# Patient Record
Sex: Male | Born: 1990 | Race: Black or African American | Hispanic: No | Marital: Single | Smoking: Current every day smoker
Health system: Southern US, Community
[De-identification: ages and names within clinical notes are randomized; demographics above are authoritative.]

## PROBLEM LIST (undated history)

## (undated) DIAGNOSIS — F329 Major depressive disorder, single episode, unspecified: Secondary | ICD-10-CM

## (undated) DIAGNOSIS — F32A Depression, unspecified: Secondary | ICD-10-CM

---
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
## 2013-03-09 ENCOUNTER — Emergency Department (HOSPITAL_COMMUNITY)
Admission: EM | Admit: 2013-03-09 | Discharge: 2013-03-11 | Disposition: A | Discharge status: Other Acute Inpatient Hospital | Payer: No Typology Code available for payment source | Attending: Emergency Medicine | Admitting: Emergency Medicine

## 2013-03-09 ENCOUNTER — Encounter (HOSPITAL_COMMUNITY): Payer: Self-pay | Admitting: Emergency Medicine

## 2013-03-09 ENCOUNTER — Emergency Department (HOSPITAL_COMMUNITY): Payer: Self-pay

## 2013-03-09 DIAGNOSIS — F32A Depression, unspecified: Secondary | ICD-10-CM

## 2013-03-09 DIAGNOSIS — M25559 Pain in unspecified hip: Secondary | ICD-10-CM | POA: Insufficient documentation

## 2013-03-09 DIAGNOSIS — F3289 Other specified depressive episodes: Secondary | ICD-10-CM | POA: Insufficient documentation

## 2013-03-09 DIAGNOSIS — R443 Hallucinations, unspecified: Secondary | ICD-10-CM | POA: Insufficient documentation

## 2013-03-09 DIAGNOSIS — R45851 Suicidal ideations: Secondary | ICD-10-CM | POA: Insufficient documentation

## 2013-03-09 DIAGNOSIS — F329 Major depressive disorder, single episode, unspecified: Secondary | ICD-10-CM

## 2013-03-09 DIAGNOSIS — F17200 Nicotine dependence, unspecified, uncomplicated: Secondary | ICD-10-CM | POA: Insufficient documentation

## 2013-03-09 DIAGNOSIS — Z0289 Encounter for other administrative examinations: Secondary | ICD-10-CM | POA: Insufficient documentation

## 2013-03-09 DIAGNOSIS — F22 Delusional disorders: Secondary | ICD-10-CM | POA: Insufficient documentation

## 2013-03-09 DIAGNOSIS — F322 Major depressive disorder, single episode, severe without psychotic features: Secondary | ICD-10-CM | POA: Diagnosis present

## 2013-03-09 HISTORY — DX: Depression, unspecified: F32.A

## 2013-03-09 HISTORY — DX: Major depressive disorder, single episode, unspecified: F32.9

## 2013-03-09 LAB — CBC WITH DIFFERENTIAL/PLATELET
BASOS ABS: 0 10*3/uL (ref 0.0–0.1)
BASOS PCT: 1 % (ref 0–1)
EOS ABS: 0.1 10*3/uL (ref 0.0–0.7)
EOS PCT: 2 % (ref 0–5)
HEMATOCRIT: 44.1 % (ref 39.0–52.0)
Hemoglobin: 15.6 g/dL (ref 13.0–17.0)
Lymphocytes Relative: 28 % (ref 12–46)
Lymphs Abs: 1.6 10*3/uL (ref 0.7–4.0)
MCH: 30.5 pg (ref 26.0–34.0)
MCHC: 35.4 g/dL (ref 30.0–36.0)
MCV: 86.3 fL (ref 78.0–100.0)
MONO ABS: 0.4 10*3/uL (ref 0.1–1.0)
Monocytes Relative: 7 % (ref 3–12)
Neutro Abs: 3.7 10*3/uL (ref 1.7–7.7)
Neutrophils Relative %: 63 % (ref 43–77)
Platelets: 223 10*3/uL (ref 150–400)
RBC: 5.11 MIL/uL (ref 4.22–5.81)
RDW: 12 % (ref 11.5–15.5)
WBC: 5.8 10*3/uL (ref 4.0–10.5)

## 2013-03-09 LAB — RAPID URINE DRUG SCREEN, HOSP PERFORMED
Amphetamines: NOT DETECTED
BENZODIAZEPINES: NOT DETECTED
Barbiturates: NOT DETECTED
COCAINE: NOT DETECTED
Opiates: NOT DETECTED
Tetrahydrocannabinol: POSITIVE — AB

## 2013-03-09 LAB — COMPREHENSIVE METABOLIC PANEL
ALBUMIN: 4.4 g/dL (ref 3.5–5.2)
ALT: 16 U/L (ref 0–53)
AST: 23 U/L (ref 0–37)
Alkaline Phosphatase: 61 U/L (ref 39–117)
BUN: 18 mg/dL (ref 6–23)
CALCIUM: 9.6 mg/dL (ref 8.4–10.5)
CO2: 26 mEq/L (ref 19–32)
CREATININE: 1.08 mg/dL (ref 0.50–1.35)
Chloride: 100 mEq/L (ref 96–112)
GFR calc Af Amer: >90 mL/min (ref >90)
GFR calc non Af Amer: >90 mL/min (ref >90)
Glucose, Bld: 92 mg/dL (ref 70–99)
POTASSIUM: 3.7 meq/L (ref 3.7–5.3)
Sodium: 139 mEq/L (ref 137–147)
TOTAL PROTEIN: 8.2 g/dL (ref 6.0–8.3)
Total Bilirubin: 0.4 mg/dL (ref 0.3–1.2)

## 2013-03-09 LAB — URINALYSIS, ROUTINE W REFLEX MICROSCOPIC
Bilirubin Urine: NEGATIVE
Glucose, UA: NEGATIVE mg/dL
Hgb urine dipstick: NEGATIVE
Ketones, ur: NEGATIVE mg/dL
LEUKOCYTES UA: NEGATIVE
NITRITE: NEGATIVE
PH: 6 (ref 5.0–8.0)
Protein, ur: NEGATIVE mg/dL
SPECIFIC GRAVITY, URINE: 1.039 — AB (ref 1.005–1.030)
Urobilinogen, UA: 1 mg/dL (ref 0.0–1.0)

## 2013-03-09 LAB — ETHANOL: Alcohol, Ethyl (B): <11 mg/dL (ref 0–11)

## 2013-03-09 LAB — ACETAMINOPHEN LEVEL: Acetaminophen (Tylenol), Serum: <15 ug/mL (ref 10–30)

## 2013-03-09 MED ORDER — ALUM & MAG HYDROXIDE-SIMETH 200-200-20 MG/5ML PO SUSP: 30.0000 mL | ORAL | Status: DC | PRN

## 2013-03-09 MED ORDER — LORAZEPAM 1 MG PO TABS: 1.0000 mg | ORAL_TABLET | Freq: Three times a day (TID) | ORAL | Status: DC | PRN

## 2013-03-09 MED ORDER — ONDANSETRON HCL 4 MG PO TABS: 4.0000 mg | ORAL_TABLET | Freq: Three times a day (TID) | ORAL | Status: DC | PRN

## 2013-03-09 MED ORDER — ACETAMINOPHEN 325 MG PO TABS: 650.0000 mg | ORAL_TABLET | ORAL | Status: DC | PRN

## 2013-03-09 MED ORDER — NICOTINE 21 MG/24HR TD PT24: 21.0000 mg | MEDICATED_PATCH | Freq: Every day | TRANSDERMAL | Status: DC

## 2013-03-09 MED ORDER — IBUPROFEN 200 MG PO TABS: 600.0000 mg | ORAL_TABLET | Freq: Three times a day (TID) | ORAL | Status: DC | PRN
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
## 2013-03-09 NOTE — ED Provider Notes (Signed)
Per the psychiatric assessment placement as recommended. [facility] not have available beds at this time. They're seeking placement elsewhere and inpatient psychiatric facilities  Rolland PorterMark Zyla Dascenzo, MD 03/09/13 2225

## 2013-03-09 NOTE — ED Notes (Signed)
Pt brought to ED from home, pt states he feels like he is going crazy. Pt speaking softly, endorses auditory hallucinations, saying "el diablo" pt states the voices do not have to tell him to hurt people. Pt endorses SI with plan to overdose as he has done in the past. Pt has strong odor of feces. Pt is voluntary at this time. Pt states he has been off his Seroquel for ?2 months.

## 2013-03-09 NOTE — ED Notes (Signed)
Pt transferred from triage presents SI without specific plan, pt reports hearing voices stating,"Kill him." Denies HI, admits to hx of depression, off meds/ Seroquel for past few mos.  Admits to hx of Depression, abusing Xanax, Percocet, Molly and Marijuana.  Feeling hopeless.  Pt calm & cooperative at present.
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
## 2013-03-10 DIAGNOSIS — F32A Depression, unspecified: Secondary | ICD-10-CM

## 2013-03-10 DIAGNOSIS — F329 Major depressive disorder, single episode, unspecified: Secondary | ICD-10-CM

## 2013-03-10 NOTE — Progress Notes (Signed)
CSW spoke with pt's aunt for collateral. Aunt and pt's mother are both in [place] right now, where Aunt lives. Aunt reports that pt's mother is currently [place] [address], but that she lives in [place]. Aunt states that the family asked pt multiple times to come with them to [place] but he refused. Aunt states that his SI is unusual. Aunt states that pt has always had a temper and for the past "many years" has said incoherent things, but that his current state is far worse than normal.   CSW spoke with pt's mom for only a moment. Pt's mom said she will be back down to [place] in next day or two. Pt's mom stated she thinks her son needs inpatient tx.   York Spaniellexandra Eiden Bagot Menominee[place]CSWA, [phone]     ED CSW

## 2013-03-10 NOTE — BH Assessment (Signed)
Per Luwanda Daniels, AC at [facility], adult unit is at capacity. Contacted the following facilities for placement:  [facility]: At capacity [facility]: At capacity [facility]: At capacity [facility]: At capacity [facility]: At capacity [facility]: At capacity [facility]: At capacity [facility]: At capacity [facility]: At capacity [facility]: At capacity  [facility]: Left voicemail  [facility]: Beds available. Faxed clinical information for review.   Anadalay Macdonell Ellis Michel Eskelson Jr, LPC, NCC Triage Specialist  

## 2013-03-10 NOTE — Progress Notes (Addendum)
CSW attempted to contact [facility], pt's contact on file, for collateral. CSW attempted 3xs, left message. CSW will continue to attempt. Per MD, Mangum was recently a psych pt here at hospital.   Mariann LasterAlexandra Zhoe Catania LCSWA, [phone]     ED CSW  1:46pm

## 2013-03-10 NOTE — [facility] Suicide Risk Assessment (Signed)
Suicide Risk Assessment  Discharge Assessment     Demographic Factors:  Male and African American Male  Mental Status Per Nursing Assessment::   On Admission:   unknown  Current Mental Status by Physician: NA  Loss Factors: NA  Historical Factors: Impulsivity  Risk Reduction Factors:   Living with another person, especially a relative and Positive social support  Continued Clinical Symptoms:  Depression:   Aggression Anhedonia Comorbid alcohol abuse/dependence Hopelessness  Cognitive Features That Contribute To Risk:  Closed-mindedness Loss of executive function Polarized thinking Thought constriction (tunnel vision)    Suicide Risk:  Minimal: No identifiable suicidal ideation.  Patients presenting with no risk factors but with morbid ruminations; may be classified as minimal risk based on the severity of the depressive symptoms  Discharge Diagnoses:   AXIS I:  Depressive Disorder NOS AXIS II:  Deferred AXIS III:   Past Medical History  Diagnosis Date  . Depression    AXIS IV:  economic problems, educational problems, housing problems, other psychosocial or environmental problems, problems related to social environment and problems with access to health care services AXIS V:  61-70 mild symptoms  Plan Of Care/Follow-up recommendations:  Activity:   as tolerated Diet:  regular Tests:  na Other:  na  Is patient on multiple antipsychotic therapies at discharge:  No   Has Patient had three or more failed trials of antipsychotic monotherapy by history:  No  Recommended Plan for Multiple Antipsychotic Therapies:  NA  Mersadez Linden 03/10/2013, 2:14 PM

## 2013-03-10 NOTE — [facility] Counselor (Signed)
Per Lucianne MussKumar via Bertis RuddyMegan Blankman NP, Lucianne MussKumar recommends pt to be placed under IVC. Per Trinna PostAlex CSW, pt's mother and aunt are concerned re: pt's SI and sts his suicidality is not his baseline.   Evette Cristalaroline Paige Felicita Nuncio, [place] Assessment Counselor

## 2013-03-11 ENCOUNTER — Inpatient Hospital Stay (HOSPITAL_COMMUNITY)
Admission: AD | Admit: 2013-03-11 | Discharge: 2013-03-17 | DRG: 885 | Disposition: A | Discharge status: Home / Self Care | Payer: No Typology Code available for payment source | Source: Intra-hospital | Attending: Psychiatry | Admitting: Psychiatry

## 2013-03-11 ENCOUNTER — Encounter (HOSPITAL_COMMUNITY): Payer: Self-pay

## 2013-03-11 DIAGNOSIS — R45851 Suicidal ideations: Secondary | ICD-10-CM

## 2013-03-11 DIAGNOSIS — F322 Major depressive disorder, single episode, severe without psychotic features: Secondary | ICD-10-CM | POA: Diagnosis present

## 2013-03-11 DIAGNOSIS — F32A Depression, unspecified: Secondary | ICD-10-CM | POA: Diagnosis present

## 2013-03-11 DIAGNOSIS — Z79899 Other long term (current) drug therapy: Secondary | ICD-10-CM

## 2013-03-11 DIAGNOSIS — F22 Delusional disorders: Secondary | ICD-10-CM

## 2013-03-11 DIAGNOSIS — R454 Irritability and anger: Secondary | ICD-10-CM | POA: Diagnosis present

## 2013-03-11 DIAGNOSIS — F200 Paranoid schizophrenia: Principal | ICD-10-CM | POA: Diagnosis present

## 2013-03-11 DIAGNOSIS — F1210 Cannabis abuse, uncomplicated: Secondary | ICD-10-CM | POA: Diagnosis present

## 2013-03-11 DIAGNOSIS — F329 Major depressive disorder, single episode, unspecified: Secondary | ICD-10-CM | POA: Diagnosis present

## 2013-03-11 DIAGNOSIS — R45850 Homicidal ideations: Secondary | ICD-10-CM

## 2013-03-11 MED ORDER — ALUM & MAG HYDROXIDE-SIMETH 200-200-20 MG/5ML PO SUSP: 30.0000 mL | ORAL | Status: DC | PRN

## 2013-03-11 MED ORDER — QUETIAPINE FUMARATE 200 MG PO TABS
200.0000 mg | ORAL_TABLET | Freq: Every day | ORAL | Status: DC
  Administered 2013-03-11: 200 mg via ORAL
  Filled 2013-03-11 (×3): qty 1

## 2013-03-11 MED ORDER — LORAZEPAM 1 MG PO TABS: 1.0000 mg | ORAL_TABLET | Freq: Three times a day (TID) | ORAL | Status: DC | PRN

## 2013-03-11 MED ORDER — IBUPROFEN 200 MG PO TABS
600.0000 mg | ORAL_TABLET | Freq: Three times a day (TID) | ORAL | Status: DC | PRN
Start: 2013-03-11 — End: 2013-03-17

## 2013-03-11 MED ORDER — QUETIAPINE FUMARATE 100 MG PO TABS: 200.0000 mg | ORAL_TABLET | Freq: Every day | ORAL | Status: DC

## 2013-03-11 MED ORDER — ACETAMINOPHEN 325 MG PO TABS: 650.0000 mg | ORAL_TABLET | Freq: Four times a day (QID) | ORAL | Status: DC | PRN

## 2013-03-11 MED ORDER — HYDROXYZINE HCL 50 MG PO TABS: 50.0000 mg | ORAL_TABLET | Freq: Every evening | ORAL | Status: DC | PRN

## 2013-03-11 MED ORDER — ACETAMINOPHEN 325 MG PO TABS: 650.0000 mg | ORAL_TABLET | ORAL | Status: DC | PRN

## 2013-03-11 MED ORDER — ONDANSETRON HCL 4 MG PO TABS: 4.0000 mg | ORAL_TABLET | Freq: Three times a day (TID) | ORAL | Status: DC | PRN

## 2013-03-11 MED ORDER — NICOTINE 21 MG/24HR TD PT24
21.0000 mg | MEDICATED_PATCH | Freq: Every day | TRANSDERMAL | Status: DC
Start: 2013-03-12 — End: 2013-03-13
  Filled 2013-03-11 (×3): qty 1

## 2013-03-11 MED ORDER — MAGNESIUM HYDROXIDE 400 MG/5ML PO SUSP: 30.0000 mL | Freq: Every day | ORAL | Status: DC | PRN

## 2013-03-11 NOTE — Tx Team (Signed)
Initial Interdisciplinary Treatment Plan  PATIENT STRENGTHS: (choose at least two) Active sense of humor Capable of independent living Communication skills  PATIENT STRESSORS: Health problems Marital or family conflict Medication change or noncompliance   PROBLEM LIST: Problem List/Patient Goals Date to be addressed Date deferred Reason deferred Estimated date of resolution  homeless      paranoia      SI                                           DISCHARGE CRITERIA:  Ability to meet basic life and health needs Adequate post-discharge living arrangements Improved stabilization in mood, thinking, and/or behavior Motivation to continue treatment in a less acute level of care Verbal commitment to aftercare and medication compliance  PRELIMINARY DISCHARGE PLAN: Attend aftercare/continuing care group Outpatient therapy Placement in alternative living arrangements  PATIENT/FAMIILY INVOLVEMENT: This treatment plan has been presented to and reviewed with the patient, Sun L Granderson.  The patient and family have been given the opportunity to ask questions and make suggestions.  Heriberto Antiguaerry, Tahliyah Anagnos M 03/11/2013, 8:52 PM

## 2013-03-11 NOTE — [facility] Counselor (Signed)
Pt has been [facility] by Dr. Ladona Ridgelaylor, going to bed 406-1, to services of Dr. Jannifer FranklinAkintayo. TTS Tom aware.  Evette Cristalaroline Paige Kameka Whan, [place] Assessment Counselor

## 2013-03-11 NOTE — B[facility] Counselor (Addendum)
Beth at [facility] states they have no adult beds.  Pt remains voluntary and isn't requesting discharge. Pt continues to meet criteria for inpatient criteria.  Evette Cristalaroline Paige Ashlan Dignan, [place] Assessment Counselor

## 2013-03-11 NOTE — ED Notes (Signed)
Report called to Joslyn Devonaroline Beaudry, RN

## 2013-03-11 NOTE — BH Assessment (Signed)
[facility] Assessment Progress Note  Per Landis MartinsMeghan Blankman, NP, pt accepted to [facility] to the service of Thedore MinsMojeed Akintayo, MD, Rm 406-1. Pt signed Voluntary Admission and Consent for Treatment. Support [facility] with originals to be sent with pt. Awaiting transport.  Doylene Canninghomas Ellsworth Waldschmidt, MA  Triage Specialist  03/11/2013 @ 16:32

## 2013-03-11 NOTE — ED Notes (Signed)
[facility] transport called [facility].

## 2013-03-11 NOTE — Consult Note (Signed)
Note reviewed and agreed with  

## 2013-03-11 NOTE — Progress Notes (Signed)
Patient ID: Andres Stewart, male   DOB: 04-12-1990, 23 y.o.   MRN: [id number] Pt is a 23yr old african [place] male that came in with SI of o.d on Seroquel 50 mg, pt stated he took 4 pills. Pt is also positive for hearing voices. Pt stated he hears the devil whispering, " only God knows, I am diablo!" Pt  originaly called the police telling them he thinks someone is trying to kill him, and he is going to kill himself before someone else does. Pt stated to writer that he would never kill himself, because then he would end up in hell, and he doesn't want to go there, even though someone has to go. Pt denies etoh use, but use THC daily. Pt was living with his mother, but has recently been kicked out and currently is homeless. Pt believes he has no support but God. Pt is cooperative and calm. Brief eye contact, and blaming others for what has been going on. Denies si/hi/avh at the moment. Denies having any pain. Pt was introduced to unit and explained rules and what is expected while on the unit. Pt remains safe on unit. No further complaints or signs of distress at this time

## 2013-03-11 NOTE — Consult Note (Signed)
  Pt is awaiting a bed on [facility]. He remains to have religiosity ("my sins can't be forgiven), confused. He feels abandoned by his mother. He has anger issues per mother and aunt. "I will kill myself if i can't go back home. He remains confused as to where his mother lives; [place] is in [place], not [place]. He currently denies SI/HI/AVH. Plan is to resume Quetiapine and increase it 200 mg at bedtime for mood/sleep/psychosis.   Psychiatric Specialty Exam: Physical Exam  ROS  Blood pressure 125/80, pulse 65, temperature 97.4 F (36.3 C), temperature source Oral, resp. rate 18, height 5\' 9"  (1.753 m), weight 68.04 kg (150 lb), SpO2 100.00%.Body mass index is 22.14 kg/(m^2).  General Appearance: Casual, Disheveled and Guarded  Eye Contact::  Minimal  Speech:  Garbled  Volume:  Decreased  Mood:  Anxious, Depressed, Dysphoric and Hopeless  Affect:  Constricted, Depressed and Restricted  Thought Process:  Circumstantial, Coherent, Irrelevant and Tangential  Orientation:  Full (Time, Place, and Person)  Thought Content:  Negative, Delusions, Hallucinations: None, Ideas of Reference:   Paranoia Delusions, Paranoid Ideation and Rumination  Suicidal Thoughts:  No  Homicidal Thoughts:  No  Memory:  Immediate;   Fair  Judgement:  Fair  Insight:  Fair  Psychomotor Activity:  Decreased  Concentration:  Poor  Recall:  Poor  Akathisia:  No  Handed:  Right  AIMS (if indicated):   0    Sleep:   poor   .me .now  .td

## 2013-03-11 NOTE — Progress Notes (Addendum)
Patient requesting to "go home." Patient states that he is "ready to leave" and that his "time here is over." The patient presents as disorganized at times. The patient appears to have little insight at this time as well. Patient referred to MD/NP for discharge concerns. Patient remains cooperative at this time. Will continue to monitor patient for safety.

## 2013-03-12 DIAGNOSIS — R45850 Homicidal ideations: Secondary | ICD-10-CM

## 2013-03-12 DIAGNOSIS — F1210 Cannabis abuse, uncomplicated: Secondary | ICD-10-CM | POA: Diagnosis present

## 2013-03-12 DIAGNOSIS — F200 Paranoid schizophrenia: Secondary | ICD-10-CM | POA: Diagnosis present

## 2013-03-12 MED ORDER — HALOPERIDOL 5 MG PO TABS
5.0000 mg | ORAL_TABLET | Freq: Two times a day (BID) | ORAL | Status: DC
  Administered 2013-03-12 – 2013-03-14 (×3): 5 mg via ORAL
  Filled 2013-03-12 (×6): qty 1

## 2013-03-12 MED ORDER — OLANZAPINE 10 MG PO TBDP
10.0000 mg | ORAL_TABLET | Freq: Three times a day (TID) | ORAL | Status: DC | PRN
  Administered 2013-03-14 – 2013-03-16 (×4): 10 mg via ORAL
  Filled 2013-03-12 (×4): qty 1

## 2013-03-12 MED ORDER — HYDROXYZINE HCL 25 MG PO TABS: 25.0000 mg | ORAL_TABLET | Freq: Four times a day (QID) | ORAL | Status: DC | PRN

## 2013-03-12 MED ORDER — FLUOXETINE HCL 20 MG PO CAPS
20.0000 mg | ORAL_CAPSULE | Freq: Every day | ORAL | Status: DC
  Administered 2013-03-12 – 2013-03-17 (×6): 20 mg via ORAL
  Filled 2013-03-12 (×2): qty 1
  Filled 2013-03-12: qty 14
  Filled 2013-03-12 (×5): qty 1

## 2013-03-12 MED ORDER — BENZTROPINE MESYLATE 1 MG PO TABS
1.0000 mg | ORAL_TABLET | Freq: Two times a day (BID) | ORAL | Status: DC
  Administered 2013-03-12 – 2013-03-14 (×3): 1 mg via ORAL
  Filled 2013-03-12 (×6): qty 1

## 2013-03-12 MED ORDER — TRAZODONE HCL 50 MG PO TABS
50.0000 mg | ORAL_TABLET | Freq: Every evening | ORAL | Status: DC | PRN
  Administered 2013-03-14 – 2013-03-16 (×2): 50 mg via ORAL
  Filled 2013-03-12: qty 14
  Filled 2013-03-12: qty 1

## 2013-03-12 NOTE — [facility] Group Notes (Signed)
[facility]  03/12/2013 , 1:22 PM    Type of Therapy:  [facility] Presentation  Participation Level:  Active  Participation Quality:  Attentive  Affect:  Blunted  Cognitive:  Oriented  Insight:  Limited  Engagement in Therapy:  Engaged  Modes of Intervention:  Discussion, Education and Socialization  Summary of Progress/Problems:  Onalee HuaDavid from [facility] came to present his recovery story and play the guitar.  Attempted to engage the presenter multiple times about spirituality, but was able to be redirected.  Came and went from group a couple of times.  Daryel Geraldorth, Daleigh Pollinger B 03/12/2013 , 1:22 PM

## 2013-03-12 NOTE — Tx Team (Signed)
  Interdisciplinary Treatment Plan Update   Date Reviewed:  03/12/2013  Time Reviewed:  8:30 AM  Progress in Treatment:   Attending groups: Yes Participating in groups: Yes Taking medication as prescribed: Yes  Tolerating medication: Yes Family/Significant other contact made: No  Patient understands diagnosis: No  Limited insight  Discussing patient identified problems/goals with staff: Yes Medical problems stabilized or resolved: Yes Denies suicidal/homicidal ideation: Yes  In tx team Patient has not harmed self or others: Yes  For review of initial/current patient goals, please see plan of care.  Estimated Length of Stay:  4-5 days  Reason for Continuation of Hospitalization: Anxiety Depression Hallucinations Medication stabilization  New Problems/Goals identified:  N/A  Discharge Plan or Barriers:   return home, follow up outpt  Additional Comments: Andres Stewart is an 23 y.o. male [facility]. Pt is oriented x's 4, alert, suspicious about the tele machine and after some time he became more willing to share information. Pt confirms that the voices are telling him that "only god knows and I hear something about diablo". Pt denies SI, when asked if he had thoughts to harm himself; yet when asked if he has access to guns the pt said "if I had one I would've shot myself". Pt confirms visual hallucinations "i see spots, i don't see people".   Attendees:  Signature: Thedore MinsMojeed Akintayo, MD 03/12/2013 8:30 AM   Signature: Richelle Itood Roizy Harold, LCSW 03/12/2013 8:30 AM  Signature: Fransisca KaufmannLaura Davis, NP 03/12/2013 8:30 AM  Signature: Joslyn Devonaroline Beaudry, RN 03/12/2013 8:30 AM  Signature: Liborio NixonPatrice White, RN 03/12/2013 8:30 AM  Signature:  03/12/2013 8:30 AM  Signature:   03/12/2013 8:30 AM  Signature:    Signature:    Signature:    Signature:    Signature:    Signature:      Scribe for Treatment Team:   Richelle Itood Tonny Isensee, LCSW  03/12/2013 8:30 AM

## 2013-03-12 NOTE — Progress Notes (Signed)
D: Pt presents with flat affect and depressed mood. Pt reports feeling paranoid and feeling like his friends are trying to kill him. According to the pt, this has been going on for the last month. Pt endorses auditory hallucinations, reports hearing the voice of the devil saying, "only God knows". Pt reports passive SI because of the paranoia and voices, wishing it would end. Pt easily agitated during treatment team with MD, NP and writer, stating that the MD asked him repeated questions and he don't like that and became labile. A: Medications administered as ordered per MD. Verbal support given. Pt encouraged to attend groups. 15 minute checks performed for safety. R: Pt irritable, paranoid and easily agitated. Pt safety maintained.

## 2013-03-12 NOTE — Progress Notes (Signed)
Patient ID: Nona DellLilvictor L Carsey, male   DOB: [birth year], 23 y.o.   MRN: [id number]  D: Pt engaged the writer in conversation more freely today than previous day, however pt was still somewhat guarded and admitted to being paranoid.  "Stated I know I'm not supposed to feel like this", referring to being paranoid. Writer asked pt about his interaction with his dr today. Stated, "he kept asking the same question over and over. I don't mind talking to him but if he ask the same question, he's going to get the same answer".  Stated that because he (the pt), got upset the Dr "dismissed him" from the room. Pt asked why he was placed back on prozac. Informed the writer that he used to take it at 23 yrs old. Pt asked if the prozac was for his paranoia. Writer informed pt that the prozac is for depression, and that the haldol and zyprexa will help with the paranoia. That together the meds should make him feel much better.   A:  Support and encouragement was offered. 15 min checks continued for safety.  R: Pt remains safe.
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
## 2013-03-12 NOTE — [facility] Group Notes (Signed)
[facility]   03/12/2013 10:40 AM  Participation Quality:  Engaged  Mood/Affect:  Irritable  Depression Rating:  7  Anxiety Rating:  7  Thoughts of Suicide:  No Will you contract for safety?   NA  Current AVH:  No  Plan for Discharge/Comments:  Pt was in group initially, called out to see the Dr., returned upset.  Stated the Dr was asking him the same question over and over, and he did not like nor appreciate it.  When asked if he needed anything from me, wondered what it was I did.  I explained, and he told me he would like to talk to me about stable housing.  No other needs noted.  Transportation Means: bus  Supports:  family  Kiribatiorth, Thereasa DistanceRodney B

## 2013-03-12 NOTE — Progress Notes (Signed)
NUTRITION ASSESSMENT  Pt identified as at risk on the Malnutrition Screen Tool  INTERVENTION: 1. Educated patient on the importance of nutrition and encouraged intake of food and beverages. 2. Discussed weight goals. 3. Supplements: none at this time.  NUTRITION DIAGNOSIS: Unintentional weight loss related to sub-optimal intake as evidenced by pt report.   Goal: Pt to meet >/= 90% of their estimated nutrition needs.  Monitor:  PO intake  Assessment:  Patient admitted with SI.  States that he is eating well currently.  Lived with mother adn stated not eating well for some time prior to admit secondary to money.  Reports UBW of 150 lbs but does not remember the last time he was that weight.  Currently 8 lbs less.  23 y.o. male  Height: Ht Readings from Last 1 Encounters:  03/11/13 5\' 9"  (1.753 m)    Weight: Wt Readings from Last 1 Encounters:  03/11/13 142 lb (64.411 kg)    Weight Hx: Wt Readings from Last 10 Encounters:  03/11/13 142 lb (64.411 kg)  03/09/13 150 lb (68.04 kg)    BMI:  Body mass index is 20.96 kg/(m^2). Pt meets criteria for normal based on current BMI.  Estimated Nutritional Needs: Kcal: 25-30 kcal/kg Protein: > 1 gram protein/kg Fluid: 1 ml/kcal  Diet Order: General Pt is also offered choice of unit snacks mid-morning and mid-afternoon.  Pt is eating as desired.   Lab results and medications reviewed.   Oran ReinLaura Jobe, RD, LDN Clinical Inpatient Dietitian Pager:  [phone] Weekend and after hours pager:  [phone]
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
## 2013-03-13 DIAGNOSIS — F200 Paranoid schizophrenia: Principal | ICD-10-CM

## 2013-03-13 DIAGNOSIS — R45851 Suicidal ideations: Secondary | ICD-10-CM

## 2013-03-13 DIAGNOSIS — F1210 Cannabis abuse, uncomplicated: Secondary | ICD-10-CM

## 2013-03-13 NOTE — Progress Notes (Signed)
Adult Psychoeducational Group Note  Date:  03/13/2013 Time:  12:02 PM  Group Topic/Focus:  Therapeutic activity.  Participation Level:  Active  Participation Quality:  Appropriate and Attentive  Affect:  Appropriate  Cognitive:  Appropriate  Insight: Appropriate and Good  Engagement in Group:  Engaged  Modes of Intervention:  Support  Additional Comments:  Pt participated in group.  Marquis Lunchbrahim, Sora Vrooman 03/13/2013, 12:02 PM

## 2013-03-13 NOTE — [facility] Group Notes (Addendum)
Adult Psychoeducational Group Note  Date:  03/13/2013 Time:  1000am  Group Topic/Focus:  Goals Group:   The focus of this group is to help patients establish daily goals to achieve during treatment and discuss how the patient can incorporate goal setting into their daily lives to aide in recovery. Orientation:   The focus of this group is to educate the patient on the purpose and policies of crisis stabilization and provide a format to answer questions about their admission.  The group details unit policies and expectations of patients while admitted.  Participation Level:  Did Not Attend  Andres Stewart, Andres Stewart Brooke 03/13/2013, 11:43 AM

## 2013-03-13 NOTE — [facility] Group Notes (Signed)
[facility] Group Notes:  (Counselor/Nursing/MHT/Case Management/Adjunct)  03/13/2013 1:15PM  Type of Therapy:  Group Therapy  Participation Level:  Active  Participation Quality:  Appropriate  Affect:  Flat  Cognitive:  Oriented  Insight:  Improving  Engagement in Group:  Limited  Engagement in Therapy:  Limited  Modes of Intervention:  Discussion, Exploration and Socialization  Summary of Progress/Problems: The topic for group was balance in life.  Pt participated in the discussion about when their life was in balance and out of balance and how this feels.  Pt discussed ways to get back in balance and short term goals they can work on to get where they want to be. Andres Stewart stated he is not sure if he is balanced or not.  Explained that this is due to the fact that even when things are going well, like now, he has this paranoia that tells him that things are going to fall apart and go south, and they usually do.  He talked about a warning sign for him that he is unbalanced is when he is angry and lashing out at others.   Andres Geraldorth, Andres Stewart B 03/13/2013 3:09 PM
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
## 2013-03-13 NOTE — Progress Notes (Signed)
[facility]  03/13/2013 2:13 PM Andres Stewart  MRN:  [id number] Subjective:   Patient states "I'm doing fine. I am hearing the voices less often. Feel a little drowsy from the medication."  Objective:  Patient is observed attending groups on the unit. Patient is reporting a decrease in his psychotic symptoms. He is engaging more with staff today but remains very paranoid. Nursing staff report that the patient was resistive to having a mouth check performed this morning. The patient became very guarded when this writer requested to see him for follow up. He had very brief responses to questions and appeared uncomfortable answering them. Patient admitted to nursing staff that he is still feeling very paranoid.   Diagnosis:   DSM5: Schizophrenia Disorders: Schizophrenia (295.7)  Obsessive-Compulsive Disorders:  Trauma-Stressor Disorders:  Substance/Addictive Disorders: Cannabis Use Disorder - Severe (304.30)  Depressive Disorders:  AXIS I: Paranoid schizophrenia, Cannabis abuse  AXIS II: Deferred  AXIS III:  Past Medical History   Diagnosis  Date   .  Depression     AXIS IV: economic problems, housing problems, occupational problems and other psychosocial or environmental problems  AXIS V: 31-40 impairment in reality testing   ADL's:  Intact  Sleep: Fair  Appetite:  Fair  Suicidal Ideation:  Passive SI to overdose  Homicidal Ideation:  Denies  AEB (as evidenced by):  Psychiatric Specialty Exam: Review of Systems  Constitutional: Negative.   HENT: Negative.   Eyes: Negative.   Respiratory: Negative.   Cardiovascular: Negative.   Gastrointestinal: Negative.   Genitourinary: Negative.   Musculoskeletal: Positive for joint pain.  Skin: Negative.   Neurological: Negative.   Endo/Heme/Allergies: Negative.   Psychiatric/Behavioral: Positive for depression, suicidal ideas, hallucinations and substance abuse. Negative for memory loss. The patient is  nervous/anxious and has insomnia.     Blood pressure 128/93, pulse 83, temperature 98.5 F (36.9 C), temperature source Oral, resp. rate 18, height 5\' 9"  (1.753 m), weight 64.411 kg (142 lb).Body mass index is 20.96 kg/(m^2).  General Appearance: Disheveled  Eye SolicitorContact::  Fair  Speech:  Clear and Coherent  Volume:  Decreased  Mood:  Dysphoric and Irritable  Affect:  Constricted  Thought Process:  Disorganized  Orientation:  Full (Time, Place, and Person)  Thought Content:  Delusions, Hallucinations: Auditory and Paranoid Ideation  Suicidal Thoughts:  Yes.  with intent/plan  Homicidal Thoughts:  No  Memory:  Immediate;   Fair Recent;   Fair Remote;   Fair  Judgement:  Impaired  Insight:  Lacking  Psychomotor Activity:  Increased  Concentration:  Fair  Recall:  Fair  Akathisia:  No  Handed:  Right  AIMS (if indicated):     Assets:  Physical Health Resilience  Sleep:  Number of Hours: 5.25   Current Medications: Current Facility-Administered Medications  Medication Dose Route Frequency Provider Last Rate Last Dose  . acetaminophen (TYLENOL) tablet 650 mg  650 mg Oral Q6H PRN Meghan Blankmann, NP      . alum & mag hydroxide-simeth (MAALOX/MYLANTA) 200-200-20 MG/5ML suspension 30 mL  30 mL Oral Q4H PRN Meghan Blankmann, NP      . benztropine (COGENTIN) tablet 1 mg  1 mg Oral BID Mojeed Akintayo   1 mg at 03/13/13 0759  . FLUoxetine (PROZAC) capsule 20 mg  20 mg Oral Daily Mojeed Akintayo   20 mg at 03/13/13 0759  . haloperidol (HALDOL) tablet 5 mg  5 mg Oral BID Mojeed Akintayo   5 mg at 03/13/13 0759  .  hydrOXYzine (ATARAX/VISTARIL) tablet 25 mg  25 mg Oral Q6H PRN Mojeed Akintayo      . ibuprofen (ADVIL,MOTRIN) tablet 600 mg  600 mg Oral Q8H PRN Meghan Blankmann, NP      . magnesium hydroxide (MILK OF MAGNESIA) suspension 30 mL  30 mL Oral Daily PRN Meghan Blankmann, NP      . OLANZapine zydis (ZYPREXA) disintegrating tablet 10 mg  10 mg Oral Q8H PRN Mojeed Akintayo      .  ondansetron (ZOFRAN) tablet 4 mg  4 mg Oral Q8H PRN Meghan Blankmann, NP      . traZODone (DESYREL) tablet 50 mg  50 mg Oral QHS PRN Fransisca Kaufmann, NP        Lab Results: No results found for this or any previous visit (from the past 48 hour(s)).  Physical Findings: AIMS: Facial and Oral Movements Muscles of Facial Expression: None, normal Lips and Perioral Area: None, normal Jaw: None, normal Tongue: None, normal,Extremity Movements Upper (arms, wrists, hands, fingers): None, normal Lower (legs, knees, ankles, toes): None, normal, Trunk Movements Neck, shoulders, hips: None, normal, Overall Severity Severity of abnormal movements (highest score from questions above): None, normal Incapacitation due to abnormal movements: None, normal Patient's awareness of abnormal movements (rate only patient's report): No Awareness,    CIWA:    COWS:     Treatment Plan Summary: Daily contact with patient to assess and evaluate symptoms and progress in treatment Medication management  Plan: Continue crisis management and stabilization.  Medication management: Reviewed with patient who stated no untoward effects. Continue Prozac 20 mg daily for depressive symptoms, Haldol 5 mg BID for psychosis.  Encouraged patient to attend groups and participate in group counseling sessions and activities.  Discharge plan in progress.  Continue current treatment plan.  Address health issues: Vitals reviewed and stable.   Medical Decision Making Problem Points:  Established problem, stable/improving (1) and Review of psycho-social stressors (1) Data Points:  Review of medication regiment & side effects (2)  I certify that inpatient services furnished can reasonably be expected to improve the patient's condition.   Cincere Zorn NP-C 03/13/2013, 2:13 PM

## 2013-03-13 NOTE — Progress Notes (Signed)
D:  Per pt self inventory pt reports sleeping fair, appetite good, energy level normal, ability to pay attention improving, rates depression at a 3 out of 10 and hopelessness at a 2 out of 10, denies SI/HI/AVH at this time, paranoid, refused to let nursing staff look inside his mouth after he took his medications this am.     A:  Emotional support provided, Encouraged pt to continue with treatment plan and attend all group activities, q15 min checks maintained for safety.  R:  Pt is calm, bizarre/paranoid behavior, did not attend group this am.

## 2013-03-14 MED ORDER — TRIHEXYPHENIDYL HCL 2 MG PO TABS
2.0000 mg | ORAL_TABLET | Freq: Every day | ORAL | Status: DC
  Administered 2013-03-14 – 2013-03-16 (×3): 2 mg via ORAL
  Filled 2013-03-14 (×4): qty 1
  Filled 2013-03-14: qty 14

## 2013-03-14 MED ORDER — FLUPHENAZINE HCL 5 MG PO TABS
5.0000 mg | ORAL_TABLET | Freq: Every day | ORAL | Status: DC
  Administered 2013-03-14 – 2013-03-16 (×3): 5 mg via ORAL
  Filled 2013-03-14: qty 14
  Filled 2013-03-14 (×4): qty 1

## 2013-03-14 NOTE — [facility] Group Notes (Signed)
[facility]   03/14/2013 11:22 AM  Participation Quality:  Engaged  Mood/Affect:  Appropriate  Depression Rating:  denies  Anxiety Rating:  denies  Thoughts of Suicide:  No Will you contract for safety?   NA  Current AVH:  No  Plan for Discharge/Comments:  We called his mother after group and told her about his progress and his plan.  She sounded hopeful, expressed her concern about Haldol as she had been prescribed that in the past herself, and stated he could return home.  He was relieved to hear this.  Transportation Means: bus  Supports: family  Kiribatiorth, Thereasa DistanceRodney B

## 2013-03-14 NOTE — Progress Notes (Signed)
Psychoeducational Group Note  Date:  03/14/2013 Time:  2000  Group Topic/Focus:  Wrap-Up Group:   The focus of this group is to help patients review their daily goal of treatment and discuss progress on daily workbooks.  Participation Level: Did Not Attend  Participation Quality:  Not Applicable  Affect:  Not Applicable  Cognitive:  Not Applicable  Insight:  Not Applicable  Engagement in Group: Not Applicable  Additional Comments:    [name], Tarance Balan R 03/14/2013, 4:06 AM

## 2013-03-14 NOTE — Progress Notes (Signed)
D: pt seen sitting in dayroom engaging with others. Pt stated he was able to talk to his family and his mother today to find out his living situation. Pt found out his mother is in [place] which upset him a little bit, but said she should be back before his d/c on Monday. Pt denies si/hi/avh. Denies having any pain. Pt stated today has been a blessed day, regarding the fact that he did speak to his family. Pt is calm and cooperative. A: q 15 min safety checks. Medications given. 1:1 time given R: pt remains safe on unit.

## 2013-03-14 NOTE — Progress Notes (Signed)
Patient ID: Andres Stewart, male   DOB: 23-Oct-1990, 23 y.o.   MRN: [id number] [facility] MD Progress Note  03/14/2013 11:26 AM Andres Stewart  MRN:  [id number] Subjective:   Patient states " I am still hearing  Voices.'' Objective:  Patient continues to reports hearing the voices of the devil taking to him. He also remains guarded, childish, paranoid, hypervigilant and says he does not trust any one. However, he has been showing decreased agitation and mood lability. He is compliant with his medications and has not endorsed any adverse reactions.  Diagnosis:   DSM5: Schizophrenia Disorders: Schizophrenia (295.7)  Obsessive-Compulsive Disorders:  Trauma-Stressor Disorders:  Substance/Addictive Disorders: Cannabis Use Disorder - Severe (304.30)  Depressive Disorders:  AXIS I: Paranoid schizophrenia, Cannabis abuse  AXIS II: Deferred  AXIS III:  Past Medical History   Diagnosis  Date   .  Depression     AXIS IV: economic problems, housing problems, occupational problems and other psychosocial or environmental problems  AXIS V: 31-40 impairment in reality testing   ADL's:  Intact  Sleep: Fair  Appetite:  Fair  Suicidal Ideation:  Passive SI to overdose  Homicidal Ideation:  Denies  AEB (as evidenced by):  Psychiatric Specialty Exam: Review of Systems  Constitutional: Negative.   HENT: Negative.   Eyes: Negative.   Respiratory: Negative.   Cardiovascular: Negative.   Gastrointestinal: Negative.   Genitourinary: Negative.   Musculoskeletal: Positive for joint pain.  Skin: Negative.   Neurological: Negative.   Endo/Heme/Allergies: Negative.   Psychiatric/Behavioral: Positive for depression, suicidal ideas, hallucinations and substance abuse. Negative for memory loss. The patient is nervous/anxious.     Blood pressure 131/69, pulse 58, temperature 97.9 F (36.6 C), temperature source Oral, resp. rate 16, height 5\' 9"  (1.753 m), weight 64.411 kg (142 lb).Body mass index  is 20.96 kg/(m^2).  General Appearance: Disheveled  Eye Solicitor::  Fair  Speech:  Clear and Coherent  Volume:  Decreased  Mood:  Dysphoric and Irritable  Affect:  Constricted  Thought Process:  Disorganized  Orientation:  Full (Time, Place, and Person)  Thought Content:  Delusions, Hallucinations: Auditory and Paranoid Ideation  Suicidal Thoughts:  Yes.  with intent/plan  Homicidal Thoughts:  No  Memory:  Immediate;   Fair Recent;   Fair Remote;   Fair  Judgement:  Impaired  Insight:  Lacking  Psychomotor Activity:  Increased  Concentration:  Fair  Recall:  Fair  Akathisia:  No  Handed:  Right  AIMS (if indicated):     Assets:  Physical Health Resilience  Sleep:  Number of Hours: 6.25   Current Medications: Current Facility-Administered Medications  Medication Dose Route Frequency Provider Last Rate Last Dose  . acetaminophen (TYLENOL) tablet 650 mg  650 mg Oral Q6H PRN Meghan Blankmann, NP      . alum & mag hydroxide-simeth (MAALOX/MYLANTA) 200-200-20 MG/5ML suspension 30 mL  30 mL Oral Q4H PRN Meghan Blankmann, NP      . FLUoxetine (PROZAC) capsule 20 mg  20 mg Oral Daily Addalynn Kumari   20 mg at 03/14/13 0812  . fluPHENAZine (PROLIXIN) tablet 5 mg  5 mg Oral QHS Audra Kagel      . hydrOXYzine (ATARAX/VISTARIL) tablet 25 mg  25 mg Oral Q6H PRN Infinity Jeffords      . ibuprofen (ADVIL,MOTRIN) tablet 600 mg  600 mg Oral Q8H PRN Meghan Blankmann, NP      . magnesium hydroxide (MILK OF MAGNESIA) suspension 30 mL  30 mL Oral Daily PRN  Kendrick FriesMeghan Blankmann, NP      . OLANZapine zydis (ZYPREXA) disintegrating tablet 10 mg  10 mg Oral Q8H PRN Frances Ambrosino      . ondansetron (ZOFRAN) tablet 4 mg  4 mg Oral Q8H PRN Kendrick FriesMeghan Blankmann, NP      . traZODone (DESYREL) tablet 50 mg  50 mg Oral QHS PRN Fransisca KaufmannLaura Davis, NP      . trihexyphenidyl (ARTANE) tablet 2 mg  2 mg Oral QHS Analys Ryden        Lab Results: No results found for this or any previous visit (from the past 48  hour(s)).  Physical Findings: AIMS: Facial and Oral Movements Muscles of Facial Expression: None, normal Lips and Perioral Area: None, normal Jaw: None, normal Tongue: None, normal,Extremity Movements Upper (arms, wrists, hands, fingers): None, normal Lower (legs, knees, ankles, toes): None, normal, Trunk Movements Neck, shoulders, hips: None, normal, Overall Severity Severity of abnormal movements (highest score from questions above): None, normal Incapacitation due to abnormal movements: None, normal Patient's awareness of abnormal movements (rate only patient's report): No Awareness,    CIWA:    COWS:     Treatment Plan Summary: Daily contact with patient to assess and evaluate symptoms and progress in treatment Medication management  Plan: Continue crisis management and stabilization.  Medication management: Reviewed with patient who stated no untoward effects. Continue Prozac 20 mg daily for depressive symptoms, Haldol 5 mg BID for psychosis.  Encouraged patient to attend groups and participate in group counseling sessions and activities.  Discharge plan in progress.  Continue current treatment plan.  Address health issues: Vitals reviewed and stable.  Discontinue Haldol per his mother's request. Initiate Proilixin 5mg  po Qhs for delusions/psychosis, Artane 2mg  po Qhs for EPS prevention. Medical Decision Making Problem Points:  Established problem, stable/improving (1) and Review of psycho-social stressors (1) Data Points:  Review of medication regiment & side effects (2)  I certify that inpatient services furnished can reasonably be expected to improve the patient's condition.   Thedore MinsAkintayo, Silvanna Ohmer, MD 03/14/2013, 11:26 AM

## 2013-03-14 NOTE — [facility] Group Notes (Signed)
[facility] LCSW Group Therapy  03/14/2013  1:05 PM  Type of Therapy:  Group therapy  Participation Level:  Active  Participation Quality:  Attentive  Affect:  Flat  Cognitive:  Oriented  Insight:  Limited  Engagement in Therapy:  Limited  Modes of Intervention:  Discussion, Socialization  Summary of Progress/Problems:  Chaplain was here to lead a group on themes of hope and courage. "I'm thinking that hope is fabricated by the devil."  Went on to explain that this is because he has often been disappointed, but also admitted that he was not putting the work into things that could make a difference for him.  "Jesus is my brother.  I talk to him for hope.  My mother and I argue a lot, but I know she just wants me to do right." Andres Stewart, Andres Stewart 03/14/2013 1:23 PM

## 2013-03-14 NOTE — Progress Notes (Signed)
Patient ID: [name], male   DOB: [birth year], 23 y.o.   MRN: [id number] D. Patient presents with guarded affect, suspicious and paranoid this am. Patient states '' I'm not going to let you look inside my mouth. I don't need those medications, what I need is weed, that's the medicine I need. But I'm not letting you look inside my mouth, that's creepy but I will take the pills '' Patient denies any AH/VH , SI/HI but continues to presents with paranoid thinking. A. Support and encouragement provided. Medications given as ordered. Discussed above information with treatment team and MD. R. Patient visible in the milieu,attending unit programming. In no acute distress at this time. Will continue to monitor q 15 minutes for safety.

## 2013-03-14 NOTE — Progress Notes (Signed)
[facility] Group Notes:  (Nursing/MHT/Case Management/Adjunct)  Date:  03/14/2013  Time:  8:00 p.m.  Type of Therapy:  Psychoeducational Skills  Participation Level:  Minimal  Participation Quality:  Intrusive  Affect:  Excited  Cognitive:  Lacking  Insight:  Limited  Engagement in Group:  Distracting  Modes of Intervention:  Education  Summary of Progress/Problems: The patient had to be redirected for throwing himself to the floor in the middle of group. He spoke out of turn at times and was redirected. The patient shared in group that he had a good day overall and that he enjoyed going to the gym. He spoke of having a strong faith in God and gave God credit for demonstrating love towards him. As a theme for the day, he intends to begin exercising as a lifestyle change.   Hazle CocaGOODMAN, Keeleigh Terris S 03/14/2013, 11:25 PM

## 2013-03-15 NOTE — Progress Notes (Signed)
Patient ID: Nona DellLilvictor L Wehling, male   DOB: 09/10/1990, 23 y.o.   MRN: [id number] Psychoeducational Group Note  Date:  03/15/2013 Time:1000am  Group Topic/Focus:  Identifying Needs:   The focus of this group is to help patients identify their personal needs that have been historically problematic and identify healthy behaviors to address their needs.  Participation Level:  Did Not Attend  Participation Quality:    Affect:  Cognitive: Insight:  Engagement in Group: Additional Comments:  Inventory and Psychoeducational group- healthy coping skills  Valente DavidWeaver, Alie Hardgrove Brooks 03/15/2013,9:43 AM

## 2013-03-15 NOTE — Progress Notes (Signed)
Patient ID: Andres Stewart, male   DOB: [birth year], 23 y.o.   MRN: [id number] [facility] Note  03/15/2013 2:52 PM Andres Stewart  MRN:  [id number] Subjective: " I am still feeling irritable and I don't want to take medication anymore.'' Objective: Patient is irritable, defiant, rude and oppositional. He has been refusing his medications and does not participates in unit activities. He  remains guarded, childish, paranoid, hypervigilant and says he does not trust any one.  Diagnosis:   DSM5: Schizophrenia Disorders: Schizophrenia (295.7)  Obsessive-Compulsive Disorders:  Trauma-Stressor Disorders:  Substance/Addictive Disorders: Cannabis Use Disorder - Severe (304.30)  Depressive Disorders:  AXIS I: Paranoid schizophrenia, Cannabis abuse  AXIS II: Deferred  AXIS III:  Past Medical History   Diagnosis  Date   .  Depression     AXIS IV: economic problems, housing problems, occupational problems and other psychosocial or environmental problems  AXIS V: 31-40 impairment in reality testing   ADL's:  Intact  Sleep: Fair  Appetite:  Fair  Suicidal Ideation:  Passive SI to overdose  Homicidal Ideation:  Denies  AEB (as evidenced by):  Psychiatric Specialty Exam: Review of Systems  Constitutional: Negative.   HENT: Negative.   Eyes: Negative.   Respiratory: Negative.   Cardiovascular: Negative.   Gastrointestinal: Negative.   Genitourinary: Negative.   Musculoskeletal: Positive for joint pain.  Skin: Negative.   Neurological: Negative.   Endo/Heme/Allergies: Negative.   Psychiatric/Behavioral: Positive for hallucinations and substance abuse. Negative for memory loss. The patient is nervous/anxious.     Blood pressure 146/91, pulse 76, temperature 97.9 F (36.6 C), temperature source Oral, resp. rate 16, height 5\' 9"  (1.753 m), weight 64.411 kg (142 lb).Body mass index is 20.96 kg/(m^2).  General Appearance: Disheveled  Eye SolicitorContact::  Fair  Speech:  Clear and  Coherent  Volume:  Decreased  Mood:  Dysphoric and Irritable  Affect:  Constricted  Thought Process:  Disorganized  Orientation:  Full (Time, Place, and Person)  Thought Content:  Delusions, Hallucinations: Auditory and Paranoid Ideation  Suicidal Thoughts:  Yes.  with intent/plan  Homicidal Thoughts:  No  Memory:  Immediate;   Fair Recent;   Fair Remote;   Fair  Judgement:  Impaired  Insight:  Lacking  Psychomotor Activity:  Increased  Concentration:  Fair  Recall:  Fair  Akathisia:  No  Handed:  Right  AIMS (if indicated):     Assets:  Physical Health Resilience  Sleep:  Number of Hours: 6.75   Current Medications: Current Facility-Administered Medications  Medication Dose Route Frequency Provider Last Rate Last Dose  . acetaminophen (TYLENOL) tablet 650 mg  650 mg Oral Q6H PRN Meghan Blankmann, NP      . alum & mag hydroxide-simeth (MAALOX/MYLANTA) 200-200-20 MG/5ML suspension 30 mL  30 mL Oral Q4H PRN Meghan Blankmann, NP      . FLUoxetine (PROZAC) capsule 20 mg  20 mg Oral Daily Gus Littler   20 mg at 03/14/13 0812  . fluPHENAZine (PROLIXIN) tablet 5 mg  5 mg Oral QHS Ashtynn Berke   5 mg at 03/14/13 2103  . hydrOXYzine (ATARAX/VISTARIL) tablet 25 mg  25 mg Oral Q6H PRN Meredeth Furber      . ibuprofen (ADVIL,MOTRIN) tablet 600 mg  600 mg Oral Q8H PRN Meghan Blankmann, NP      . magnesium hydroxide (MILK OF MAGNESIA) suspension 30 mL  30 mL Oral Daily PRN Meghan Blankmann, NP      . OLANZapine zydis (ZYPREXA) disintegrating tablet  10 mg  10 mg Oral Q8H PRN Medora Roorda   10 mg at 03/14/13 2103  . ondansetron (ZOFRAN) tablet 4 mg  4 mg Oral Q8H PRN Kendrick Fries, NP      . traZODone (DESYREL) tablet 50 mg  50 mg Oral QHS PRN Fransisca Kaufmann, NP   50 mg at 03/14/13 2207  . trihexyphenidyl (ARTANE) tablet 2 mg  2 mg Oral QHS Delroy Ordway   2 mg at 03/14/13 2103    Lab Results: No results found for this or any previous visit (from the past 48 hour(s)).  Physical  Findings: AIMS: Facial and Oral Movements Muscles of Facial Expression: None, normal Lips and Perioral Area: None, normal Jaw: None, normal Tongue: None, normal,Extremity Movements Upper (arms, wrists, hands, fingers): None, normal Lower (legs, knees, ankles, toes): None, normal, Trunk Movements Neck, shoulders, hips: None, normal, Overall Severity Severity of abnormal movements (highest score from questions above): None, normal Incapacitation due to abnormal movements: None, normal Patient's awareness of abnormal movements (rate only patient's report): No Awareness,    CIWA:    COWS:     Treatment Plan Summary: Daily contact with patient to assess and evaluate symptoms and progress in treatment Medication management  Plan: Continue crisis management and stabilization.  Medication management: Reviewed with patient who stated no untoward effects. Continue Prozac 20 mg daily for depressive symptoms, Haldol 5 mg BID for psychosis.  Encouraged patient to attend groups and participate in group counseling sessions and activities.  Discharge plan in progress.  Continue current treatment plan.  Address health issues: Vitals reviewed and stable.  Continue Proilixin 5mg  po Qhs for delusions/psychosis, Artane 2mg  po Qhs for EPS prevention. Encourage patient to take his medications. Medical Decision Making Problem Points:  Established problem, stable/improving (1) and Review of psycho-social stressors (1) Data Points:  Review of medication regiment & side effects (2)  I certify that inpatient services furnished can reasonably be expected to improve the patient's condition.   Thedore Mins, MD 03/15/2013, 2:52 PM

## 2013-03-15 NOTE — Progress Notes (Signed)
Seen and agreed. Kazden Largo, MD 

## 2013-03-15 NOTE — Progress Notes (Signed)
Patient ID: [name], male   DOB: 1990/09/29, 23 y.o.   MRN: [id number] D. Patient presents with irritable mood, affect labile. In am, writer approached patient to assess and patient in bed, and states '' you are fucking disturbing me. Pt refused to answer or speak with writer but then stated '' I'm tired of ya'll pushing these fucking pills '' A. Support and encouragement provided. Medications offered and patient refused. Discussed above information with Dr. Jannifer FranklinAkintayo. R. Patient has been isolative to room throughout shift thus far. Will continue to monitor q 15 minutes for safety.

## 2013-03-15 NOTE — [facility] Group Notes (Signed)
[facility] Group Notes:  (Clinical Social Work)  03/15/2013  11:00-11:45AM  Summary of Progress/Problems:   The main focus of today's process group was for the patient to identify ways in which they have in the past sabotaged their own recovery and reasons they may have done this/what they received from doing it.  We then worked to identify a specific plan to avoid doing this when discharged from the hospital for this admission.  The patient expressed that he does not need medication, and that if he tells the doctor he does not like the way the medication makes him sleep, the doctor will simply try a different medication, whereas he wants to have none at all.  CSW mentioned that someone in his life must be concerned about the way he has acted in order for him to be in the hospital, and he remained adamant that nothing is wrong with him.  He participated well in group and was respectful, even humorous with other patients.  They joined CSW in advising him to not simply refuse meds but to give reasons why.  Type of Therapy:  Group Therapy - Process  Participation Level:  Active  Participation Quality:  Attentive, Sharing and Supportive  Affect:  Appropriate and Irritable  Cognitive:  Appropriate and Oriented  Insight:  Improving  Engagement in Therapy:  Engaged  Modes of Intervention:  Clarification, Education, Exploration, Discussion  Ambrose MantleMareida Grossman-Orr, LCSW 03/15/2013, 12:09 PM

## 2013-03-15 NOTE — Progress Notes (Signed)
Adult Psychoeducational Group Note  Date:  03/15/2013 Time:  8:00 pm  Group Topic/Focus:  Wrap-Up Group:   The focus of this group is to help patients review their daily goal of treatment and discuss progress on daily workbooks.  Participation Level:  Did Not Attend   Modena NunneryJohnson, Zakeria Kulzer 03/15/2013, 11:33 PM

## 2013-03-16 NOTE — [facility] Group Notes (Signed)
[facility] Group Notes:  (Clinical Social Work)  03/16/2013   11:15am-12:00pm  Summary of Progress/Problems:  The main focus of today's process group was to listen to a variety of genres of music and to identify that different types of music provoke different responses.  The patient then was able to identify personally what was soothing for them, as well as energizing.  Handouts were used to record feelings evoked, as well as how patient can personally use this knowledge in sleep habits, with depression, and with other symptoms.  The patient expressed understanding of concepts, as well as knowledge of how each type of music affected them and how this can be used when they are at home as a tool in their recovery.  Andres Stewart was very interactive with the counselor and with other patients throughout group, somewhat intrusive at times.  He was happy throughout group and even danced, was able to identify his feelings appropriately each time.  Type of Therapy:  Music Therapy   Participation Level:  Active  Participation Quality:  Attentive and Sharing and Supportive  Affect:  Appropriate  Cognitive:  Oriented  Insight:  Engaged  Engagement in Therapy:  Engaged  Modes of Intervention:   Activity, Exploration  Ambrose MantleMareida Grossman-Orr, LCSW 03/16/2013, 12:30pm

## 2013-03-16 NOTE — Progress Notes (Signed)
Psychoeducational Group Note  Date:  03/16/2013 Time:  8:00 p.m.   Group Topic/Focus:  Wrap-Up Group:   The focus of this group is to help patients review their daily goal of treatment and discuss progress on daily workbooks.  Participation Level: Did Not Attend  Participation Quality:  Not Applicable  Affect:  Not Applicable  Cognitive:  Not Applicable  Insight:  Not Applicable  Engagement in Group: Not Applicable  Additional Comments:  The patient did not attend group this evening since he was asleep in his bed.   Hazle CocaGOODMAN, Finnick Orosz S 03/16/2013, 9:58 PM

## 2013-03-16 NOTE — Progress Notes (Signed)
Patient ID: Andres DellLilvictor L Stewart, male   DOB: 01/19/1991, 23 y.o.   MRN: [id number] D. Th e patient spent the evening resting in bed with eyes closed. No distress noted.  A. Attempt made to awaken for evening group. Awakened for HS medication. R. Did not attend evening group. Irritable when awakened for HS medication. Returned to sleep after taking HS medication.

## 2013-03-16 NOTE — [facility] Group Notes (Signed)
[facility] Group Notes:  (Nursing/MHT/Case Management/Adjunct)  Date:  03/16/2013  Time:  11:00 AM  Type of Therapy:  Psychoeducational Skills  Participation Level:  Minimal  Participation Quality:  Inattentive  Affect:  Irritable  Cognitive:  Lacking  Insight:  Limited  Engagement in Group:  Lacking  Modes of Intervention:  Discussion, Education and Exploration  Summary of Progress/Problems:self inventory review, and psychoeducational review with RN - focus of this group was on healthy support systems, and identify negative patterns to replace with positive ones.    Malva LimesStrader, Briceida Rasberry 03/16/2013, 11:00 AM

## 2013-03-16 NOTE — Progress Notes (Signed)
Patient ID: Nona Dell, male   DOB: 08-30-1990, 23 y.o.   MRN: [id number] [facility] MD Progress Note  03/16/2013 3:59 PM Jsaon MAURISIO RUDDY  MRN:  [id number] Subjective: " I am ready to be discharged home, I am tired of staying in the hospital, my mama wants me back home.'' Objective: Patient is less agitated or  Irritable today but he remains rude, defiant and oppositional. His behavior has been unpredictable, gets agitated easily and acst very paranoid. He is partially compliant with his medications often requires to be prompted and persuaded. He has not been fully participating in the unit activities either. However, he denies psychosis, suicidal or homicidal ideation, intent or plan. Diagnosis:   DSM5: Schizophrenia Disorders: Schizophrenia (295.7)  Obsessive-Compulsive Disorders:  Trauma-Stressor Disorders:  Substance/Addictive Disorders: Cannabis Use Disorder - Severe (304.30)  Depressive Disorders:  AXIS I: Paranoid schizophrenia, Cannabis abuse  AXIS II: Deferred  AXIS III:  Past Medical History   Diagnosis  Date   .  Depression     AXIS IV: economic problems, housing problems, occupational problems and other psychosocial or environmental problems  AXIS V: 40-50 moderate symptoms  ADL's:  Intact  Sleep: Fair  Appetite:  Fair  Suicidal Ideation:  Passive SI to overdose  Homicidal Ideation:  Denies  AEB (as evidenced by):  Psychiatric Specialty Exam: Review of Systems  Constitutional: Negative.   HENT: Negative.   Eyes: Negative.   Respiratory: Negative.   Cardiovascular: Negative.   Gastrointestinal: Negative.   Genitourinary: Negative.   Musculoskeletal: Positive for joint pain.  Skin: Negative.   Neurological: Negative.   Endo/Heme/Allergies: Negative.   Psychiatric/Behavioral: Negative for memory loss. The patient is nervous/anxious.     Blood pressure 137/86, pulse 64, temperature 97.4 F (36.3 C), temperature source Oral, resp. rate 16, height 5\' 9"   (1.753 m), weight 64.411 kg (142 lb).Body mass index is 20.96 kg/(m^2).  General Appearance: fairly groomed  Patent attorney::  Fair  Speech:  Clear and Coherent  Volume:  loud  Mood:  Irritable  Affect:  labile  Thought Process: tangential  Orientation:  Full (Time, Place, and Person)  Thought Content:  Delusions  Suicidal Thoughts:  denies  Homicidal Thoughts:  No  Memory:  Immediate;   Fair Recent;   Fair Remote;   Fair  Judgement:  marginal  Insight:  impaired  Psychomotor Activity:  Increased  Concentration:  Fair  Recall:  Fair  Akathisia:  No  Handed:  Right  AIMS (if indicated):     Assets:  Physical Health Resilience  Sleep:  Number of Hours: 6   Current Medications: Current Facility-Administered Medications  Medication Dose Route Frequency Provider Last Rate Last Dose  . acetaminophen (TYLENOL) tablet 650 mg  650 mg Oral Q6H PRN Meghan Blankmann, NP      . alum & mag hydroxide-simeth (MAALOX/MYLANTA) 200-200-20 MG/5ML suspension 30 mL  30 mL Oral Q4H PRN Meghan Blankmann, NP      . FLUoxetine (PROZAC) capsule 20 mg  20 mg Oral Daily Minola Guin   20 mg at 03/16/13 0757  . fluPHENAZine (PROLIXIN) tablet 5 mg  5 mg Oral QHS Deaken Jurgens   5 mg at 03/15/13 2233  . hydrOXYzine (ATARAX/VISTARIL) tablet 25 mg  25 mg Oral Q6H PRN Shadae Reino      . ibuprofen (ADVIL,MOTRIN) tablet 600 mg  600 mg Oral Q8H PRN Meghan Blankmann, NP      . magnesium hydroxide (MILK OF MAGNESIA) suspension 30 mL  30 mL Oral  Daily PRN Kendrick FriesMeghan Blankmann, NP      . OLANZapine zydis (ZYPREXA) disintegrating tablet 10 mg  10 mg Oral Q8H PRN Jadin Creque   10 mg at 03/16/13 0757  . ondansetron (ZOFRAN) tablet 4 mg  4 mg Oral Q8H PRN Kendrick FriesMeghan Blankmann, NP      . traZODone (DESYREL) tablet 50 mg  50 mg Oral QHS PRN Fransisca KaufmannLaura Davis, NP   50 mg at 03/14/13 2207  . trihexyphenidyl (ARTANE) tablet 2 mg  2 mg Oral QHS Clarann Helvey   2 mg at 03/15/13 2233    Lab Results: No results found for this or  any previous visit (from the past 48 hour(s)).  Physical Findings: AIMS: Facial and Oral Movements Muscles of Facial Expression: None, normal Lips and Perioral Area: None, normal Jaw: None, normal Tongue: None, normal,Extremity Movements Upper (arms, wrists, hands, fingers): None, normal Lower (legs, knees, ankles, toes): None, normal, Trunk Movements Neck, shoulders, hips: None, normal, Overall Severity Severity of abnormal movements (highest score from questions above): None, normal Incapacitation due to abnormal movements: None, normal Patient's awareness of abnormal movements (rate only patient's report): No Awareness,    CIWA:    COWS:     Treatment Plan Summary: Daily contact with patient to assess and evaluate symptoms and progress in treatment Medication management  Plan: Continue crisis management and stabilization.  Medication management: Reviewed with patient who stated no untoward effects. Continue Prozac 20 mg daily for depressive symptoms.  Encouraged patient to attend groups and participate in group counseling sessions and activities.  Discharge plan in progress.  Continue current treatment plan.  Address health issues: Vitals reviewed and stable.  Continue Proilixin 5mg  po Qhs for delusions/psychosis, Artane 2mg  po Qhs for EPS prevention. Encourage patient to take his medications. Medical Decision Making Problem Points:  Established problem, stable/improving (1) and Review of psycho-social stressors (1) Data Points:  Review of medication regiment & side effects (2)  I certify that inpatient services furnished can reasonably be expected to improve the patient's condition.   Thedore MinsAkintayo, Adaira Centola, MD 03/16/2013, 3:59 PM

## 2013-03-16 NOTE — Progress Notes (Signed)
Patient ID: [name], male   DOB: 1991/03/02, 23 y.o.   MRN: [id number] D. Patient continues to present with very irritable mood today.  In am patient states '' Im tired of filling out those stupid sheets. '' Patient denies any other concerns and is not forthcoming with Clinical research associatewriter. A. Support and encouragement provided. Medications offered and patient accepted although hesitant. Discussed above information with Dr. Jannifer FranklinAkintayo. R. Patient has been isolative to room throughout shift thus far. Will continue to monitor q 15 minutes for safety.

## 2013-03-17 DIAGNOSIS — F1994 Other psychoactive substance use, unspecified with psychoactive substance-induced mood disorder: Secondary | ICD-10-CM

## 2013-03-17 MED ORDER — TRAZODONE HCL 50 MG PO TABS: 50.0000 mg | ORAL_TABLET | Freq: Every evening | ORAL | Status: AC | PRN

## 2013-03-17 MED ORDER — FLUOXETINE HCL 20 MG PO CAPS: 20.0000 mg | ORAL_CAPSULE | Freq: Every day | ORAL | Status: AC

## 2013-03-17 MED ORDER — TRIHEXYPHENIDYL HCL 2 MG PO TABS: 2.0000 mg | ORAL_TABLET | Freq: Every day | ORAL | Status: AC

## 2013-03-17 MED ORDER — FLUPHENAZINE HCL 5 MG PO TABS: 5.0000 mg | ORAL_TABLET | Freq: Every day | ORAL | Status: AC

## 2013-03-17 NOTE — [facility] Suicide Risk Assessment (Signed)
[facility] INPATIENT:  Family/Significant Other Suicide Prevention Education  Suicide Prevention Education:  Education Completed; Awilda Metroatricia Hocutt, mother, [phone] has been identified by the patient as the family member/significant other with whom the patient will be residing, and identified as the person(s) who will aid the patient in the event of a mental health crisis (suicidal ideations/suicide attempt).  With written consent from the patient, the family member/significant other has been provided the following suicide prevention education, prior to the and/or following the discharge of the patient.  The suicide prevention education provided includes the following:  Suicide risk factors  Suicide prevention and interventions  National Suicide Hotline telephone number  [facility] assessment telephone number  [facility] 911  [facility] and/or [facility] telephone number  Request made of family/significant other to:  Remove weapons (e.g., guns, rifles, knives), all items previously/currently identified as safety concern.    Remove drugs/medications (over-the-counter, prescriptions, illicit drugs), all items previously/currently identified as a safety concern.  The family member/significant other verbalizes understanding of the suicide prevention education information provided.  The family member/significant other agrees to remove the items of safety concern listed above.  Daryel Geraldorth, Gauri Galvao B 03/17/2013, 9:49 AM

## 2013-03-17 NOTE — Progress Notes (Signed)
D: pt lying in bed easily woken. Writer asked why pt kept to himself today and was isolative. Pt stated that his mom is still in [place], and he expected her to be back by now. Pt stated he was supposed to be d/c today but since his mom isnt back he has nowhere to go. Pt stated he doesn't know where he will go if his mom cant get a rid back here. Denies si/hi/avh. Denies pain. Pt took all medication without any problems A: q 15 min safety checks. 1:1 time given R: pt remains safe on unit. No further complaints at this time

## 2013-03-17 NOTE — Discharge Summary (Signed)
Physician Discharge Summary Note  Patient:  Andres Stewart is an 23 y.o., male MRN:  [id number] DOB:  [birth year] Patient phone:  [phone] (home)  Patient address:   [address] [place] [id number],   Date of Admission:  03/11/2013 Date of Discharge: 03/17/13  Reason for Admission:  Auditory hallucinations telling the patient to overdose   Discharge Diagnoses: Principal Problem:   Schizophrenia, paranoid Active Problems:   MDD (Andres depressive disorder), severe   Depressed   Cannabis abuse  Review of Systems  Constitutional: Negative.   HENT: Negative.   Eyes: Negative.   Respiratory: Negative.   Cardiovascular: Negative.   Gastrointestinal: Negative.   Genitourinary: Negative.   Musculoskeletal: Negative.   Skin: Negative.   Neurological: Negative.   Endo/Heme/Allergies: Negative.   Psychiatric/Behavioral: Positive for substance abuse. Negative for depression, hallucinations and memory loss. The patient is not nervous/anxious and does not have insomnia.     DSM5: AXIS I: Schizophrenia, paranoid  Cannabis use disorder.  Cannabis induced mood disorder  AXIS II: Cluster B Traits  AXIS III:  Past Medical History   Diagnosis  Date   .  Depression     AXIS IV: other psychosocial or environmental problems, problems related to social environment and problems with primary support group  AXIS V: 61-70 mild symptoms  Level of Care:  OP  Hospital Course:  Andres Stewart is an 23 y.o. male [facility] to auditory hallucinations. Patient was accompanied by [employer]. The patient reported being noncompliant with the Seroquel [facility] with a recent increase in auditory hallucinations. In the ED that patient reported hearing voices and passive suicidal thoughts to overdose. Today during his admission assessment the patient became very irritable claiming that the MD had asked him the same question several times. This writer was  present during the interview not observing his complaint to be valid. Patient stated "I have been paranoid for about a month. I think because I was disobedient to my mother. Felt like somebody wanted to kill me so I thought about doing it first. I think I hear the voice of the devil saying Only God knows, I'm Diablo. I have homicidal thoughts towards anybody." When asked about had he been feeling irritable became angry stating "Yeah I'm irritable with you asking me the same question." The patient raised his voice during this time and was heard to mumble under his breath "These people do not know what they are doing." Patient was noted to be a poor historian due to his current psychotic state.          Andres Stewart was admitted to the adult unit. He was evaluated and his symptoms were identified. Medication management was discussed and initiated. Patient was started on Prolixin 5 mg at hs for psychosis and, Artane 2 mg at hs for EPS prevention.  He was oriented to the unit and encouraged to participate in unit programming. Medical problems were identified and treated appropriately. Home medication was restarted as needed.        The patient was evaluated each day by a clinical provider to ascertain the patient's response to treatment. During the first part of his admission the patient was noted to be very irritable, defiant, and paranoid. The patient is documented to have refused some of his medications reporting that he did not trust staff. Patient also was prone to isolate in his room rather than attending the scheduled groups. He also refused to  fill out his self inventory sheet at times stating "I'm tired of filling out those stupid sheets."  Gradually improvement was noted by the patient's report of decreasing symptoms, improved sleep and appetite, affect, medication tolerance, behavior, and participation in unit programming.  Andres L Bassettwas asked each day to complete a self inventory noting  mood, mental status, pain, new symptoms, anxiety and concerns.         He responded well to medication and being in a therapeutic and supportive environment. Positive and appropriate behavior was noted and the patient was motivated for recovery.  Andres Stewart worked closely with the treatment team and case manager to develop a discharge plan with appropriate goals. Coping skills, problem solving as well as relaxation therapies were also part of the unit programming.         By the day of discharge Andres Stewart was in much improved condition than upon admission.  Symptoms were reported as significantly decreased or resolved completely.  The patient denied SI/HI and voiced no AVH. He was motivated to continue taking medication with a goal of continued improvement in mental health.          Andres Stewart was discharged home with a plan to follow up as noted below.  Consults:  None  Significant Diagnostic Studies:  labs: Admission labs completed and reviewed.   Discharge Vitals:   Blood pressure 132/88, pulse 106, temperature 97.5 F (36.4 C), temperature source Oral, resp. rate 17, height 5\' 9"  (1.753 m), weight 64.411 kg (142 lb). Body mass index is 20.96 kg/(m^2). Lab Results:   No results found for this or any previous visit (from the past 72 hour(s)).  Physical Findings: AIMS: Facial and Oral Movements Muscles of Facial Expression: None, normal Lips and Perioral Area: None, normal Jaw: None, normal Tongue: None, normal,Extremity Movements Upper (arms, wrists, hands, fingers): None, normal Lower (legs, knees, ankles, toes): None, normal, Trunk Movements Neck, shoulders, hips: None, normal, Overall Severity Severity of abnormal movements (highest score from questions above): None, normal Incapacitation due to abnormal movements: None, normal Patient's awareness of abnormal movements (rate only patient's report): No Awareness,    CIWA:    COWS:     Psychiatric  Specialty Exam: See Psychiatric Specialty Exam and Suicide Risk Assessment completed by Attending Physician prior to discharge.  Discharge destination:  Home  Is patient on multiple antipsychotic therapies at discharge:  No   Has Patient had three or more failed trials of antipsychotic monotherapy by history:  No  Recommended Plan for Multiple Antipsychotic Therapies: NA     Medication List    STOP taking these medications       QUEtiapine 50 MG tablet  Commonly known as:  SEROQUEL      TAKE these medications     Indication   FLUoxetine 20 MG capsule  Commonly known as:  PROZAC  Take 1 capsule (20 mg total) by mouth daily.   Indication:  Andres Depressive Disorder     fluPHENAZine 5 MG tablet  Commonly known as:  PROLIXIN  Take 1 tablet (5 mg total) by mouth at bedtime.   Indication:  Psychosis, Schizophrenia     traZODone 50 MG tablet  Commonly known as:  DESYREL  Take 1 tablet (50 mg total) by mouth at bedtime as needed for sleep.   Indication:  Trouble Sleeping     trihexyphenidyl 2 MG tablet  Commonly known as:  ARTANE  Take 1 tablet (2 mg total) by  mouth at bedtime.   Indication:  Extrapyramidal Reaction caused by Medications           Follow-up Information   Follow up with [facility]. (Go to the walk-in clinic between 8and 9AM M-F for your hospital follow-up appointment.  while there, make sure you tell the therapist or Dr that you want help with employment.  The man who runs that program is Jamesetta Orleans.)    Contact information:   [address]  [place]  [phone]      Follow-up recommendations:   Activity: as tolerated  Diet: healthy  Tests: routine  Other: patient to keep his after care appointment   Comments:   Take all your medications as prescribed by your mental healthcare provider.  Report any adverse effects and or reactions from your medicines to your outpatient provider promptly.  Patient is instructed and cautioned to not engage in  alcohol and or illegal drug use while on prescription medicines.  In the event of worsening symptoms, patient is instructed to call the crisis hotline, 911 and or go to the nearest ED for appropriate evaluation and treatment of symptoms.  Follow-up with your primary care provider for your other medical issues, concerns and or health care needs.   Total Discharge Time:  Greater than 30 minutes.  SignedFransisca Kaufmann NP-C 03/17/2013, 9:55 AM

## 2013-03-17 NOTE — Progress Notes (Signed)
[facility] Discharge [facility] :  Will you be returning to the same living situation after discharge: Yes,  home At discharge, do you have transportation home?:Yes,  mother/bus pass Do you have the ability to pay for your medications:Yes,  mental health  Release of information consent forms completed and in the chart;  Patient's signature needed at discharge.  Patient to Follow up at: Follow-up Information   Follow up with [facility]. (Go to the walk-in clinic between 8and 9AM M-F for your hospital follow-up appointment.  while there, make sure you tell the therapist or Dr that you want help with employment.  The man who runs that program is Jamesetta OrleansLenard Cain.)    Contact information:   [address]  [place]  [phone]      Patient denies SI/HI:   Yes,  yes    Safety Planning and Suicide Prevention discussed:  Yes,  yes  Ida Rogueorth, Coree Brame B 03/17/2013, 9:55 AM

## 2013-03-17 NOTE — Tx Team (Signed)
  Interdisciplinary Treatment Plan Update   Date Reviewed:  03/17/2013  Time Reviewed:  9:51 AM  Progress in Treatment:   Attending groups: Yes Participating in groups: Yes Taking medication as prescribed: Yes  Tolerating medication: Yes Family/Significant other contact made: Yes  Patient understands diagnosis: Yes  Discussing patient identified problems/goals with staff: Yes Medical problems stabilized or resolved: Yes Denies suicidal/homicidal ideation: Yes Patient has not harmed self or others: Yes  For review of initial/current patient goals, please see plan of care.  Estimated Length of Stay:  D/C today  Reason for Continuation of Hospitalization:   New Problems/Goals identified:  N/A  Discharge Plan or Barriers:   return home, follow up outpt  Additional Comments:  Attendees:  Signature: Thedore MinsMojeed Akintayo, MD 03/17/2013 9:51 AM   Signature: Richelle Itood Delano Scardino, LCSW 03/17/2013 9:51 AM  Signature: Fransisca KaufmannLaura Davis, NP 03/17/2013 9:51 AM  Signature: Joslyn Devonaroline Beaudry, RN 03/17/2013 9:51 AM  Signature: Liborio NixonPatrice White, RN 03/17/2013 9:51 AM  Signature:  03/17/2013 9:51 AM  Signature:   03/17/2013 9:51 AM  Signature:    Signature:    Signature:    Signature:    Signature:    Signature:      Scribe for Treatment Team:   Richelle Itood Denny Mccree, LCSW  03/17/2013 9:51 AM

## 2013-03-17 NOTE — [facility] Suicide Risk Assessment (Signed)
Suicide Risk Assessment  Discharge Assessment     Demographic Factors:  Male, Low socioeconomic status, Unemployed and male  Mental Status Per Nursing Assessment::   On Admission:  NA  Current Mental Status by Physician: patient denies suicidal ideation, intent or plan  Loss Factors: Financial problems/change in socioeconomic status  Historical Factors: Impulsivity  Risk Reduction Factors:   Sense of responsibility to family, Living with another person, especially a relative and Positive social support  Continued Clinical Symptoms:  Alcohol/Substance Abuse/Dependencies  Cognitive Features That Contribute To Risk:  Polarized thinking    Suicide Risk:  Minimal: No identifiable suicidal ideation.  Patients presenting with no risk factors but with morbid ruminations; may be classified as minimal risk based on the severity of the depressive symptoms  Discharge Diagnoses:   AXIS I:  Schizophrenia, paranoid              Cannabis use disorder.              Cannabis induced mood disorder AXIS II:  Cluster B Traits AXIS III:   Past Medical History  Diagnosis Date  . Depression    AXIS IV:  other psychosocial or environmental problems, problems related to social environment and problems with primary support group AXIS V:  61-70 mild symptoms  Plan Of Care/Follow-up recommendations:  Activity:  as tolerated Diet:  healthy Tests:  routine Other:  patient to keep his after care appointment  Is patient on multiple antipsychotic therapies at discharge:  No   Has Patient had three or more failed trials of antipsychotic monotherapy by history:  No  Recommended Plan for Multiple Antipsychotic Therapies: NA  Thedore MinsAkintayo, Konrad Hoak, MD 03/17/2013, 9:05 AM

## 2013-03-17 NOTE — Progress Notes (Signed)
Discharge note: Pt received both written and verbal discharge instructions. Pt agreed to f/u appt and med regimen. Pt denies SI/HI/AVH at this time. Pt received all belongings from room and locker. Pt received sample meds and prescriptions. Pt safely left [facility].

## 2013-03-18 NOTE — Discharge Summary (Signed)
Seen and agreed. Tawny Raspberry, MD 

## 2013-03-19 NOTE — ED Provider Notes (Signed)
Medical screening examination/treatment/procedure(s) were performed by non-physician practitioner and as supervising physician I was immediately available for consultation/collaboration.  EKG Interpretation   None         Macallister Ashmead, MD 03/19/13 0021 

## 2013-03-20 NOTE — Progress Notes (Signed)
Patient Discharge Instructions:  After Visit Summary (AVS):   Faxed to:  03/20/13 Discharge Summary Note:   Faxed to:  03/20/13 Psychiatric Admission Assessment Note:   Faxed to:  03/20/13 Suicide Risk Assessment - Discharge Assessment:   Faxed to:  03/20/13 Faxed/Sent to the Next Level Care provider:  03/20/13 [facility] to [facility] @ [phone]  Jerelene ReddenSheena E [place], 03/20/2013, 4:03 PM

## 2015-04-22 ENCOUNTER — Encounter (HOSPITAL_COMMUNITY): Payer: Self-pay | Admitting: Emergency Medicine

## 2015-04-22 ENCOUNTER — Emergency Department (HOSPITAL_COMMUNITY)
Admission: EM | Admit: 2015-04-22 | Discharge: 2015-04-22 | Disposition: A | Discharge status: Home / Self Care | Payer: Self-pay | Attending: Emergency Medicine | Admitting: Emergency Medicine

## 2015-04-22 DIAGNOSIS — Z79899 Other long term (current) drug therapy: Secondary | ICD-10-CM | POA: Insufficient documentation

## 2015-04-22 DIAGNOSIS — F17200 Nicotine dependence, unspecified, uncomplicated: Secondary | ICD-10-CM | POA: Insufficient documentation

## 2015-04-22 DIAGNOSIS — M545 Low back pain: Secondary | ICD-10-CM | POA: Insufficient documentation

## 2015-04-22 DIAGNOSIS — F329 Major depressive disorder, single episode, unspecified: Secondary | ICD-10-CM | POA: Insufficient documentation

## 2015-04-22 DIAGNOSIS — R630 Anorexia: Secondary | ICD-10-CM | POA: Insufficient documentation

## 2015-04-22 DIAGNOSIS — H6123 Impacted cerumen, bilateral: Secondary | ICD-10-CM | POA: Insufficient documentation

## 2015-04-22 DIAGNOSIS — B349 Viral infection, unspecified: Secondary | ICD-10-CM | POA: Insufficient documentation

## 2015-04-22 LAB — CBG MONITORING, ED
Glucose-Capillary: 61 mg/dL — ABNORMAL LOW (ref 65–99)
Glucose-Capillary: 99 mg/dL (ref 65–99)

## 2015-04-22 NOTE — ED Notes (Signed)
Notified Charna Elizabeth and [employer] PA of CG\BG 61 mg/dL.  Patient given [place] sandwich, apple sauce and orange juice.

## 2015-04-22 NOTE — ED Provider Notes (Signed)
CSN: [id number]     Arrival date & time 04/22/15  1349 History  By signing my name below, I, Freida Busman, attest that this documentation has been prepared under the direction and in the presence of non-physician practitioner, Santiago Glad, PA-C. Electronically Signed: Freida Busman, Scribe. 04/22/2015. 3:05 PM.    Chief Complaint  Patient presents with  . Generalized Body Aches    The history is provided by the patient. No language interpreter was used.    HPI Comments:  Andres Stewart is a 25 y.o. male who presents to the Emergency Department complaining of generalized body aches and  lower back pain which began yesterday and worsened this AM . He reports associated HA,  fatigue, dry cough, and chills . No alleviating factors noted. He denies fever, sore throat, nausea and vomiting, abdominal pain, neck pain/stiffness, or vision changes. Pt states he has been drinking as he normally would but has only had peanut butter to eat in the last 3 days as he has not been able to afford food. He has not tried any medications.   Past Medical History  Diagnosis Date  . Depression    History reviewed. No pertinent past surgical history. History reviewed. No pertinent family history. Social History  Substance Use Topics  . Smoking status: Current Every Day Smoker  . Smokeless tobacco: None  . Alcohol Use: No    Review of Systems  Constitutional: Positive for chills and fatigue.  HENT: Negative for sore throat.   Respiratory: Positive for cough.   Gastrointestinal: Negative for nausea and vomiting.  Musculoskeletal: Positive for myalgias (generalized) and back pain.  Neurological: Positive for headaches.  All other systems reviewed and are negative.   Allergies  Review of patient's allergies indicates no known allergies.  Home Medications   Prior to Admission medications   Medication Sig Start Date End Date Taking? Authorizing Provider  FLUoxetine (PROZAC) 20 MG capsule Take  1 capsule (20 mg total) by mouth daily. 03/17/13   Thermon Leyland, NP  fluPHENAZine (PROLIXIN) 5 MG tablet Take 1 tablet (5 mg total) by mouth at bedtime. 03/17/13   Thermon Leyland, NP  traZODone (DESYREL) 50 MG tablet Take 1 tablet (50 mg total) by mouth at bedtime as needed for sleep. 03/17/13   Thermon Leyland, NP  trihexyphenidyl (ARTANE) 2 MG tablet Take 1 tablet (2 mg total) by mouth at bedtime. 03/17/13   Thermon Leyland, NP   BP 116/82 mmHg  Pulse 73  Temp(Src) 99.2 F (37.3 C) (Oral)  Resp 18  SpO2 98% Physical Exam  Constitutional: He is oriented to person, place, and time. He appears well-developed and well-nourished. No distress.  HENT:  Head: Normocephalic and atraumatic.  Mouth/Throat: Oropharynx is clear and moist. No oropharyngeal exudate, posterior oropharyngeal edema or posterior oropharyngeal erythema.  Bilateral cerumen impaction   Eyes: Conjunctivae are normal. Pupils are equal, round, and reactive to light.  Neck: Normal range of motion. Neck supple.  Cardiovascular: Normal rate and regular rhythm.   Pulmonary/Chest: Effort normal and breath sounds normal.  Abdominal: He exhibits no distension.  Musculoskeletal: Normal range of motion.  Neurological: He is alert and oriented to person, place, and time. He has normal strength. No cranial nerve deficit or sensory deficit. Gait normal.  Muscle strength 5/5  Skin: Skin is warm and dry.  Psychiatric: He has a normal mood and affect.  Nursing note and vitals reviewed.   ED Course  Procedures   DIAGNOSTIC STUDIES:  Oxygen Saturation is 98% on RA, normal by my interpretation.    COORDINATION OF CARE:  2:46 PM Discussed treatment plan with pt at bedside and pt agreed to plan.   Labs Review Labs Reviewed  CBG MONITORING, ED - Abnormal; Notable for the following:    Glucose-Capillary 61 (*)    All other components within normal limits    I have personally reviewed and evaluated these  lab results as part of my  medical decision-making.   MDM   Final diagnoses:  None    Pt symptoms consistent with viral illness. Pt will be discharged with symptomatic treatment.  Discussed return precautions.  Pt is hemodynamically stable & in NAD prior to discharge.  Patient also reports that he has been eating less over the past couple of days.  CBG 61 initially in the ED.  Patient ate a sandwich and drank juice in the ED, which brought his sugar up.  Feel that the patient is stable for discharge.  Return precautions given.    I personally performed the services described in this documentation, which was scribed in my presence. The recorded information has been reviewed and is accurate.    Santiago Glad, PA-C 04/24/15 1007  Gerhard Munch, MD 04/28/15 1728

## 2015-04-22 NOTE — ED Notes (Signed)
Patient is alert and orientedx4.  Patient was explained discharge instructions and they understood them with no questions.   

## 2015-04-22 NOTE — ED Notes (Signed)
Pt sts body aches starting this am; pt sts has not eaten in 3 days due to no money for food; pt sts some cough

## 2016-03-15 ENCOUNTER — Other Ambulatory Visit: Payer: Self-pay | Admitting: Occupational Medicine

## 2016-03-15 ENCOUNTER — Ambulatory Visit: Payer: Self-pay

## 2016-03-15 DIAGNOSIS — M25532 Pain in left wrist: Secondary | ICD-10-CM
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
## 2018-01-08 ENCOUNTER — Emergency Department (HOSPITAL_COMMUNITY): Admission: EM | Admit: 2018-01-08 | Discharge: 2018-01-08 | Discharge status: Absent without leave | Payer: Self-pay

## 2018-01-08 NOTE — ED Notes (Signed)
Pt. Was called x3. Pt. Was found sitting outside of the hospital refusing to be seen by nurse for triaging. Nurse aware of pt.

## 2018-01-14 ENCOUNTER — Encounter (HOSPITAL_COMMUNITY): Payer: Self-pay | Admitting: Emergency Medicine

## 2018-01-14 ENCOUNTER — Emergency Department (HOSPITAL_COMMUNITY)
Admission: EM | Admit: 2018-01-14 | Discharge: 2018-01-14 | Disposition: A | Discharge status: Home / Self Care | Payer: Self-pay | Attending: Emergency Medicine | Admitting: Emergency Medicine

## 2018-01-14 DIAGNOSIS — F17210 Nicotine dependence, cigarettes, uncomplicated: Secondary | ICD-10-CM | POA: Insufficient documentation

## 2018-01-14 DIAGNOSIS — S3992XA Unspecified injury of lower back, initial encounter: Secondary | ICD-10-CM | POA: Insufficient documentation

## 2018-01-14 DIAGNOSIS — M542 Cervicalgia: Secondary | ICD-10-CM

## 2018-01-14 DIAGNOSIS — M545 Low back pain, unspecified: Secondary | ICD-10-CM

## 2018-01-14 DIAGNOSIS — S0990XA Unspecified injury of head, initial encounter: Secondary | ICD-10-CM | POA: Insufficient documentation

## 2018-01-14 DIAGNOSIS — Y92410 Unspecified street and highway as the place of occurrence of the external cause: Secondary | ICD-10-CM | POA: Insufficient documentation

## 2018-01-14 DIAGNOSIS — Z79899 Other long term (current) drug therapy: Secondary | ICD-10-CM | POA: Insufficient documentation

## 2018-01-14 DIAGNOSIS — R51 Headache: Secondary | ICD-10-CM

## 2018-01-14 DIAGNOSIS — F1210 Cannabis abuse, uncomplicated: Secondary | ICD-10-CM | POA: Insufficient documentation

## 2018-01-14 DIAGNOSIS — F209 Schizophrenia, unspecified: Secondary | ICD-10-CM | POA: Insufficient documentation

## 2018-01-14 DIAGNOSIS — Y998 Other external cause status: Secondary | ICD-10-CM | POA: Insufficient documentation

## 2018-01-14 DIAGNOSIS — R519 Headache, unspecified: Secondary | ICD-10-CM

## 2018-01-14 DIAGNOSIS — Y93I9 Activity, other involving external motion: Secondary | ICD-10-CM | POA: Insufficient documentation

## 2018-01-14 DIAGNOSIS — S199XXA Unspecified injury of neck, initial encounter: Secondary | ICD-10-CM | POA: Insufficient documentation
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
## 2018-01-14 NOTE — Discharge Instructions (Signed)
Alternate 600 mg of ibuprofen and 500-1000 mg of Tylenol every 3 hours as needed for pain. Do not exceed 4000 mg of Tylenol daily. Ice to areas of soreness for the next few days and then may move to heat. Do some gentle stretching throughout the day, especially during hot showers or baths. Take short frequent walks and avoid prolonged periods of sitting or laying. Expect to be sore for the next few day and follow up with primary care physician for recheck of ongoing symptoms but return to ER for emergent changing or worsening of symptoms such as severe headache that gets worse, altered mental status/behaving unusually, persistent vomiting, excessive drowsiness, numbness to the arms or legs, unsteady gait, or slurred speech. ° °
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
